# Patient Record
Sex: Female | Born: 2000 | Race: White | Hispanic: No | Marital: Single | Smoking: Never smoker
Health system: Southern US, Community
[De-identification: ages and names within clinical notes are randomized; demographics above are authoritative.]

---
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
## 2022-11-02 ENCOUNTER — Emergency Department (HOSPITAL_COMMUNITY)
Admission: EM | Admit: 2022-11-02 | Discharge: 2022-11-02 | Disposition: A | Discharge status: Home / Self Care | Payer: BC Managed Care – PPO | Attending: Emergency Medicine | Admitting: Emergency Medicine

## 2022-11-02 ENCOUNTER — Encounter (HOSPITAL_COMMUNITY): Payer: Self-pay | Admitting: Emergency Medicine

## 2022-11-02 ENCOUNTER — Other Ambulatory Visit: Payer: Self-pay

## 2022-11-02 ENCOUNTER — Emergency Department (HOSPITAL_COMMUNITY): Payer: BC Managed Care – PPO

## 2022-11-02 DIAGNOSIS — R1031 Right lower quadrant pain: Secondary | ICD-10-CM

## 2022-11-02 DIAGNOSIS — J069 Acute upper respiratory infection, unspecified: Secondary | ICD-10-CM | POA: Insufficient documentation

## 2022-11-02 DIAGNOSIS — Z20822 Contact with and (suspected) exposure to covid-19: Secondary | ICD-10-CM | POA: Diagnosis not present

## 2022-11-02 LAB — URINALYSIS, ROUTINE W REFLEX MICROSCOPIC
Glucose, UA: NEGATIVE mg/dL
Hgb urine dipstick: NEGATIVE
Ketones, ur: NEGATIVE mg/dL
Leukocytes,Ua: NEGATIVE
Nitrite: NEGATIVE
Protein, ur: NEGATIVE mg/dL
Specific Gravity, Urine: >1.03 — ABNORMAL HIGH (ref 1.005–1.030)
pH: 6 (ref 5.0–8.0)

## 2022-11-02 LAB — I-STAT BETA HCG BLOOD, ED (MC, WL, AP ONLY): I-stat hCG, quantitative: <5 m[IU]/mL (ref <5)

## 2022-11-02 LAB — CBC WITH DIFFERENTIAL/PLATELET
Abs Immature Granulocytes: 0.06 10*3/uL (ref 0.00–0.07)
Basophils Absolute: 0 10*3/uL (ref 0.0–0.1)
Basophils Relative: 0 %
Eosinophils Absolute: 0 10*3/uL (ref 0.0–0.5)
Eosinophils Relative: 0 %
HCT: 39.2 % (ref 36.0–46.0)
Hemoglobin: 12.7 g/dL (ref 12.0–15.0)
Immature Granulocytes: 0 %
Lymphocytes Relative: 11 %
Lymphs Abs: 1.5 10*3/uL (ref 0.7–4.0)
MCH: 26.3 pg (ref 26.0–34.0)
MCHC: 32.4 g/dL (ref 30.0–36.0)
MCV: 81.2 fL (ref 80.0–100.0)
Monocytes Absolute: 1 10*3/uL (ref 0.1–1.0)
Monocytes Relative: 7 %
Neutro Abs: 11.2 10*3/uL — ABNORMAL HIGH (ref 1.7–7.7)
Neutrophils Relative %: 82 %
Platelets: 292 10*3/uL (ref 150–400)
RBC: 4.83 MIL/uL (ref 3.87–5.11)
RDW: 13.4 % (ref 11.5–15.5)
WBC: 13.9 10*3/uL — ABNORMAL HIGH (ref 4.0–10.5)
nRBC: 0 % (ref 0.0–0.2)

## 2022-11-02 LAB — COMPREHENSIVE METABOLIC PANEL
ALT: 12 U/L (ref 0–44)
AST: 16 U/L (ref 15–41)
Albumin: 3.9 g/dL (ref 3.5–5.0)
Alkaline Phosphatase: 47 U/L (ref 38–126)
Anion gap: 9 (ref 5–15)
BUN: 7 mg/dL (ref 6–20)
CO2: 24 mmol/L (ref 22–32)
Calcium: 8.9 mg/dL (ref 8.9–10.3)
Chloride: 105 mmol/L (ref 98–111)
Creatinine, Ser: 0.83 mg/dL (ref 0.44–1.00)
GFR, Estimated: >60 mL/min (ref >60)
Glucose, Bld: 111 mg/dL — ABNORMAL HIGH (ref 70–99)
Potassium: 3.5 mmol/L (ref 3.5–5.1)
Sodium: 138 mmol/L (ref 135–145)
Total Bilirubin: 1.6 mg/dL — ABNORMAL HIGH (ref 0.3–1.2)
Total Protein: 6.9 g/dL (ref 6.5–8.1)

## 2022-11-02 LAB — RESP PANEL BY RT-PCR (RSV, FLU A&B, COVID)  RVPGX2
Influenza A by PCR: NEGATIVE
Influenza B by PCR: NEGATIVE
Resp Syncytial Virus by PCR: NEGATIVE
SARS Coronavirus 2 by RT PCR: NEGATIVE

## 2022-11-02 LAB — WET PREP, GENITAL
Clue Cells Wet Prep HPF POC: NONE SEEN
Sperm: NONE SEEN
Trich, Wet Prep: NONE SEEN
WBC, Wet Prep HPF POC: <10 (ref <10)
Yeast Wet Prep HPF POC: NONE SEEN

## 2022-11-02 LAB — LIPASE, BLOOD: Lipase: 34 U/L (ref 11–51)

## 2022-11-02 MED ORDER — MORPHINE SULFATE (PF) 4 MG/ML IV SOLN
4.0000 mg | Freq: Once | INTRAVENOUS | Status: AC
  Administered 2022-11-02: 4 mg via INTRAVENOUS
  Filled 2022-11-02: qty 1

## 2022-11-02 MED ORDER — DOXYCYCLINE HYCLATE 100 MG PO TABS: 100.0000 mg | ORAL_TABLET | Freq: Two times a day (BID) | ORAL | 0 refills | Status: AC

## 2022-11-02 MED ORDER — LIDOCAINE HCL (PF) 1 % IJ SOLN: 1.0000 mL | Freq: Once | INTRAMUSCULAR | Status: DC

## 2022-11-02 MED ORDER — ONDANSETRON HCL 4 MG/2ML IJ SOLN
4.0000 mg | Freq: Once | INTRAMUSCULAR | Status: AC
  Administered 2022-11-02: 4 mg via INTRAVENOUS
  Filled 2022-11-02: qty 2

## 2022-11-02 MED ORDER — ACETAMINOPHEN 325 MG PO TABS
650.0000 mg | ORAL_TABLET | Freq: Once | ORAL | Status: AC
  Administered 2022-11-02: 650 mg via ORAL
  Filled 2022-11-02: qty 2

## 2022-11-02 MED ORDER — ONDANSETRON 4 MG PO TBDP: 4.0000 mg | ORAL_TABLET | Freq: Three times a day (TID) | ORAL | 0 refills | Status: AC | PRN

## 2022-11-02 MED ORDER — SODIUM CHLORIDE 0.9 % IV BOLUS
1000.0000 mL | Freq: Once | INTRAVENOUS | Status: AC
  Administered 2022-11-02: 1000 mL via INTRAVENOUS

## 2022-11-02 MED ORDER — SODIUM CHLORIDE 0.9 % IV SOLN
1.0000 g | Freq: Once | INTRAVENOUS | Status: AC
  Administered 2022-11-02: 1 g via INTRAVENOUS
  Filled 2022-11-02: qty 10

## 2022-11-02 MED ORDER — CEFTRIAXONE SODIUM 500 MG IJ SOLR: 500.0000 mg | Freq: Once | INTRAMUSCULAR | Status: DC

## 2022-11-02 MED ORDER — IOHEXOL 350 MG/ML SOLN
75.0000 mL | Freq: Once | INTRAVENOUS | Status: AC | PRN
  Administered 2022-11-02: 75 mL via INTRAVENOUS

## 2022-11-02 NOTE — Discharge Instructions (Addendum)
You were evaluated in the emergency department today for abdominal pain and URI.  We have tested you for covid, flu, and RSV, and these results are still pending.  Even if these are positive, they are treated the same as the common cold, with further descriptions below.  Results can be checked via MyChart/portal.  Treatment of an upper respiratory infection (URI) is directed at relieving symptoms.  There is no cure for acute viral infections; it is usually best to let them run their course.  Symptoms usually last around 1-2 weeks, though cough may occasionally linger for up to 3-4 weeks.  As discussed, antibiotics are not effective with viral infections, because the infection is likely caused by a virus, not by bacteria.   Treatments may include:  Increased fluid intake. Sports drinks offer valuable electrolytes, sugars, and fluids.  Breathing heated mist or steam (vaporizer or shower).  Eating chicken soup or other clear broths, and maintaining good nutrition.  Getting plenty of rest.  Using lozenges, tea with honey, or warm salt water for cough relief/sore throat relief (Cepkaol or Halls lozenges are available over-the-counter) Increasing usage of your inhaler if you have asthma.   Take over-the-counter Benadryl, Zyrtec, or Claritin to decrease sinus secretions, with Mucinex to help break up remaining mucus Continue to alternate between Tylenol and ibuprofen for pain and fever control.  You may return to work 24 hours after your temperature has returned to normal.  -----  The CT of your abdomen/pelvis was not indicative of acute conditions such as appendicitis or kidney infection.  It is possible this may be related to a GI bug, STI, or other benign cause.  You were provided 1 dose of antibiotics in the emergency department, the other antibiotics have been sent to the pharmacy by the name of doxycycline.  Please take as directed for the next week.  The tests for gonorrhea/chlamydia have not  resulted, you should receive a phone call if the results are positive within 2 to 3 days with further instructions.  Fortunately the medications provided to treat these 2 STIs have been provided for you (the one given to you in the ED and the one sent to the pharmacy), so your treatment would not change.  However the remaining test, called a Wet Prep, checks for bacterial vaginosis, yeast infection, or trichomonas and is still pending.  You should receive a phone call tomorrow if any results of the Wet Prep are positive with further instruction.    You are also able to view any of these results when they become available through your MyChart, further instructions have been provided in your discharge paperwork on how to do so.    In addition, further information regarding abdominal pain has been provided for you to review.  Should you experience any of these warning signs, return to the ED for further evaluation.  -----  Please follow up with your primary care doctor in by the end of this week for recheck of ongoing symptoms.  Again return to emergency department for emergent changing or worsening of symptoms.

## 2022-11-02 NOTE — ED Provider Triage Note (Signed)
Emergency Medicine Provider Triage Evaluation Note  Laurencia Reinheimer , a 22 y.o. female  was evaluated in triage.  Pt complains of diarrhea and RLQ pain x 3 days ago. Was seen at [facility] and told to come in to rule out appendicitis.  Patient has associated nausea vomiting.  Denies urinary symptoms, vaginal discharge, vaginal bleeding.  Review of Systems  Positive:  Negative:   Physical Exam  BP 121/79 (BP Location: Right Arm)   Pulse (!) 128   Temp 98.9 F (37.2 C) (Oral)   Resp 17   SpO2 99%  Gen:   Awake, no distress   Resp:  Normal effort  MSK:   Moves extremities without difficulty  Other:  TTP noted to RLQ  Medical Decision Making  Medically screening exam initiated at 1:10 PM.  Appropriate orders placed.  Vanita Mercer was informed that the remainder of the evaluation will be completed by another provider, this initial triage assessment does not replace that evaluation, and the importance of remaining in the ED until their evaluation is complete.  Work up initiated   [place], [employer], PA-C 11/02/22 1311

## 2022-11-02 NOTE — ED Triage Notes (Signed)
Patient here for evaluation of diarrhea and RLQ abdominal pain that started three days ago. Patient is alert, oriented, and in no apparent distress at this time.

## 2022-11-02 NOTE — ED Provider Notes (Signed)
[place] Provider Note   CSN: [id number] Arrival date & time: 11/02/22  1259     History {Add pertinent medical, surgical, social history, OB history to HPI:1} Chief Complaint  Patient presents with   Abdominal Pain    Laura Tyler is a 22 y.o. female with Hx of social anxiety disorder and migraines presenting to the ED for several days of diarrhea and RLQ pain.  Symptoms began 3 days ago, with RLQ pain with diarrhea.  Also with some nausea and bilious nonbloody vomiting, most recent episode in the waiting room today.  Has not taken anything for symptom management.  Unsure of fevers or chills.  Denies blood in vomit or stool.  Denies possibility of pregnancy.  Denies urinary symptoms, vaginal bleeding, or new/abnormal vaginal discharge.  Endorses decreased appetite and food/fluid intake.  Also notes increased sinus congestion and early cough starting this morning.  Went to urgent care/fast med per patient and tested negative for COVID/flu/strep.  Denies difficulty swallowing, shortness of breath, headache, neck stiffness.  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  Also reports increased stressors at home.  The history is provided by the patient and medical records.  Abdominal Pain     Home Medications Prior to Admission medications   Medication Sig Start Date End Date Taking? Authorizing Provider  doxycycline (VIBRA-TABS) 100 MG tablet Take 1 tablet (100 mg total) by mouth 2 (two) times daily for 7 days. 11/02/22 11/09/22 Yes Prince Rome, PA-C  ibuprofen (ADVIL,MOTRIN) 200 MG tablet Take 200-400 mg by mouth every 6 (six) hours as needed for headache.    [provider]  naproxen (NAPROSYN) 500 MG tablet Take 1 tablet (500 mg total) by mouth 2 (two) times daily with a meal. 10/26/20   Jaynee Eagles, PA-C      Allergies    Patient has no known allergies.    Review of Systems    Review of Systems  Gastrointestinal:  Positive for abdominal pain.    Physical Exam Updated Vital Signs BP 114/69   Pulse (!) 111   Temp 99.9 F (37.7 C) (Oral)   Resp 18   SpO2 99%  Physical Exam Vitals and nursing note reviewed.  Constitutional:      General: She is not in acute distress.    Appearance: She is well-developed.  HENT:     Head: Normocephalic and atraumatic.     Comments: Airway patent.  Erythema of the posterior oropharynx.  Without tonsillar exudate, swelling, abscess, or erythema.  Handling secretions without difficulty.  Without tongue elevation or deviation.  Uvula midline with mild swelling.    Mouth/Throat:     Mouth: Mucous membranes are moist.     Pharynx: Oropharynx is clear. No pharyngeal swelling or oropharyngeal exudate.  Eyes:     General: No scleral icterus.    Conjunctiva/sclera: Conjunctivae normal.  Neck:     Comments: Mild cervical lymphadenopathy bilat.  No meningismus or torticollis. Cardiovascular:     Rate and Rhythm: Normal rate and regular rhythm.     Heart sounds: No murmur heard. Pulmonary:     Effort: Pulmonary effort is normal. No respiratory distress.     Breath sounds: Normal breath sounds.  Abdominal:     General: There is no distension.     Palpations: Abdomen is soft. There is no shifting dullness.     Tenderness: There is abdominal tenderness. There is no  right CVA tenderness, left CVA tenderness or guarding. Negative signs include Murphy's sign and McBurney's sign.    Genitourinary:    Comments: Deferred Musculoskeletal:        General: No swelling.     Cervical back: Neck supple.  Skin:    General: Skin is warm and dry.     Capillary Refill: Capillary refill takes less than 2 seconds.     Coloration: Skin is not cyanotic, jaundiced or pale.     Findings: No rash.  Neurological:     Mental Status: She is alert and oriented to person, place, and time.  Psychiatric:        Mood and Affect: Mood is anxious.      ED Results / Procedures / Treatments   Labs (all labs ordered are listed, but only abnormal results are displayed) Labs Reviewed  CBC WITH DIFFERENTIAL/PLATELET - Abnormal; Notable for the following components:      Result Value   WBC 13.9 (*)    Neutro Abs 11.2 (*)    All other components within normal limits  COMPREHENSIVE METABOLIC PANEL - Abnormal; Notable for the following components:   Glucose, Bld 111 (*)    Total Bilirubin 1.6 (*)    All other components within normal limits  URINALYSIS, ROUTINE W REFLEX MICROSCOPIC - Abnormal; Notable for the following components:   Color, Urine ORANGE (*)    APPearance CLOUDY (*)    Specific Gravity, Urine >1.030 (*)    Bilirubin Urine SMALL (*)    All other components within normal limits  RESP PANEL BY RT-PCR (RSV, FLU A&B, COVID)  RVPGX2  WET PREP, GENITAL  LIPASE, BLOOD  I-STAT BETA HCG BLOOD, ED ([facility], [facility], [facility] ONLY)  GC/CHLAMYDIA PROBE AMP ([place]) NOT AT [facility]    EKG None  Radiology CT ABDOMEN PELVIS W CONTRAST  Result Date: 11/02/2022 CLINICAL DATA:  Right lower quadrant abdominal pain EXAM: CT ABDOMEN AND PELVIS WITH CONTRAST TECHNIQUE: Multidetector CT imaging of the abdomen and pelvis was performed using the standard protocol following bolus administration of intravenous contrast. RADIATION DOSE REDUCTION: This exam was performed according to the departmental dose-optimization program which includes automated exposure control, adjustment of the mA and/or kV according to patient size and/or use of iterative reconstruction technique. CONTRAST:  69m OMNIPAQUE IOHEXOL 350 MG/ML SOLN COMPARISON:  None Available. FINDINGS: Lower chest: No acute abnormality. Hepatobiliary: No focal liver abnormality is seen. No gallstones, gallbladder wall thickening, or biliary dilatation. Pancreas: Unremarkable Spleen: Unremarkable Adrenals/Urinary Tract: Adrenal glands are unremarkable. Kidneys are normal, without renal calculi, focal lesion,  or hydronephrosis. Bladder is unremarkable. Stomach/Bowel: Stomach is within normal limits. Appendix appears normal. No evidence of bowel wall thickening, distention, or inflammatory changes. Vascular/Lymphatic: No significant vascular findings are present. No enlarged abdominal or pelvic lymph nodes. Reproductive: 2 cm unilocular cystic lesion is seen within the right vaginal wall peripherally most in keeping with a Bartholin cyst. The pelvic organs are otherwise unremarkable. Other: No abdominal wall hernia or abnormality. No abdominopelvic ascites. Musculoskeletal: No acute or significant osseous findings. IMPRESSION: 1. No acute intra-abdominal pathology identified. No definite radiographic explanation for the patient's reported symptoms. Electronically Signed   By: AFidela SalisburyM.D.   On: 11/02/2022 18:09    Procedures Procedures  {Document cardiac monitor, telemetry assessment procedure when appropriate:1}  Medications Ordered in ED Medications  lidocaine (PF) (XYLOCAINE) 1 % injection 1-2.1 mL (has no administration in time range)  cefTRIAXone (ROCEPHIN) 1 g in sodium chloride 0.9 %  100 mL IVPB (has no administration in time range)  acetaminophen (TYLENOL) tablet 650 mg (has no administration in time range)  morphine (PF) 4 MG/ML injection 4 mg (4 mg Intravenous Given 11/02/22 1602)  ondansetron (ZOFRAN) injection 4 mg (4 mg Intravenous Given 11/02/22 1602)  sodium chloride 0.9 % bolus 1,000 mL (0 mLs Intravenous Stopped 11/02/22 1656)  iohexol (OMNIPAQUE) 350 MG/ML injection 75 mL (75 mLs Intravenous Contrast Given 11/02/22 1754)    ED Course/ Medical Decision Making/ A&P Clinical Course as of 11/02/22 1907  Mon Nov 02, 2022  1515 WBC(!): 13.9 [AC]  1515 Total Bilirubin(!): 1.6 [AC]  1812 CT ABDOMEN PELVIS W CONTRAST No acute pathology [AC]    Clinical Course User Index [AC] Prince Rome, PA-C   {   Click here for ABCD2, HEART and other calculatorsREFRESH Note before signing  :1}                          Medical Decision Making Amount and/or Complexity of Data Reviewed Radiology: ordered.   22 y.o. female presents to the ED for concern of Abdominal Pain   This involves an extensive number of treatment options, and is a complaint that carries with it a high risk of complications and morbidity.  The emergent differential diagnosis prior to evaluation includes, but is not limited to: Appendicitis, Bowel Obstruction, Pyelonephritis, Nephrolithiasis, Pancreatitis, Cholecystitis, Perforated Bowel or Ulcer, Diverticulosis/itis, Ischemic Mesentery, Inflammatory Bowel Disease, Strangulated/Incarcerated Hernia, gastritis, PUD, TOA, or ovarian torsion.    This is not an exhaustive differential.   Past Medical History / Co-morbidities / Social History: Hx of social anxiety disorder and migraines Social Determinants of Health include: None  Additional History:  None  Lab Tests: I ordered, and personally interpreted labs.  The pertinent results include:   Negative i-STAT beta-hCG UA with small amounts of bilirubin, without evidence of UTI or hematuria Total bili 1.6 WBC 13.9 with leukocytosis Lipase 34  Imaging Studies: I ordered imaging studies including CT abd/p.   I independently visualized and interpreted imaging which showed negative for acute pathology.  I agree with the radiologist interpretation.  ED Course: Pt well-appearing on exam.  Minimal RLQ tenderness.  Nontoxic, nonseptic appearing, in no apparent distress.  Patient's pain and other symptoms adequately managed in ED.  Fluid bolus given.  Labs, history and vitals reviewed.  Patient does not meet the SIRS or Sepsis criteria.  Plan for CT imaging and reassess.  Tested at UC earlier today and was negative for COVID, flu, and strep.  Symptoms of URI present on exam today, with patent airway and low suspicion for tonsillitis, RPA, or PTA at this time.   On repeat exam patient does not have a surgical  abdomen and there are no peritoneal signs.  Mild-moderate improvement appreciated.  Negative pregnancy, doubt ectopic.  UA without evidence of UTI, no suprapubic or CVA tenderness, and patient without urinary/vaginal symptoms.  Doubt UTI, acute cystitis, pyelonephritis.  No rash.  Imaging pending.   Imaging without indication of appendicitis, bowel obstruction, bowel perforation, renal calculi, cholecystitis, pancreatitis, pyelonephritis, diverticulitis, TOA, or ectopic pregnancy.   Patient now accompanied at bedside by mother, whom she gives verbal authorization/consent to discuss medical information with.   Shared decision making with pt.  Again improved RLQ tenderness without radiation, N/V, or pelvic pain.  Clinical presentation provides low suspicion for ovarian torsion.  Patient now expresses possibility of STI exposure and increased concern for URI symptoms, including  nasal congestion and sore throat.  Pt has never previously had a pelvic exam and has elected to defer GU/pelvic exam at this time.  Pt has opted to self swab instead and proceed with empiric antibiotics of ceftriaxone and doxycycline for possible PUD.  Pt is hemodynamically stable with mild improved tachycardia since arrival and continued anxiousness overall.  I find this reasonable.  Again negative for covid, flu and strep at [facility] today per pt.   Recommend continued conservative management with close PCP follow up for re-evaluation.  Patient reports satisfaction with today's encounter.  Patient in NAD and in good condition at time of discharge.  Disposition: After consideration the patient's encounter today, I do not feel today's workup suggests an emergent condition requiring admission or immediate intervention beyond what has been performed at this time.  Safe for discharge; instructed to return immediately for worsening symptoms, change in symptoms or any other concerns.  I have reviewed the patients home medicines and have made  adjustments as needed.  Discussed course of treatment with the patient, whom demonstrated understanding.  Patient in agreement and has no further questions.    I discussed this case with my attending physician Dr. Darl Householder.  Attending physician stated agreement with plan or made changes to plan which were implemented.     This chart was dictated using voice recognition software.  Despite best efforts to proofread, errors can occur which can change the documentation meaning.   {Document critical care time when appropriate:1} {Document review of labs and clinical decision tools ie heart score, Chads2Vasc2 etc:1}  {Document your independent review of radiology images, and any outside records:1} {Document your discussion with family members, caretakers, and with consultants:1} {Document social determinants of health affecting pt's care:1} {Document your decision making why or why not admission, treatments were needed:1} Final Clinical Impression(s) / ED Diagnoses Final diagnoses:  Right lower quadrant abdominal pain    Rx / DC Orders ED Discharge Orders          Ordered    doxycycline (VIBRA-TABS) 100 MG tablet  2 times daily        11/02/22 1831

## 2022-11-03 LAB — GC/CHLAMYDIA PROBE AMP (~~LOC~~) NOT AT ARMC
Chlamydia: NEGATIVE
Comment: NEGATIVE
Comment: NORMAL
Neisseria Gonorrhea: NEGATIVE
